# Patient Record
Sex: Female | Born: 1990 | Race: White | Hispanic: No | Marital: Married | Smoking: Former smoker
Health system: Southern US, Community
[De-identification: ages and names within clinical notes are randomized; demographics above are authoritative.]

## PROBLEM LIST (undated history)

## (undated) DIAGNOSIS — R569 Unspecified convulsions: Secondary | ICD-10-CM

## (undated) DIAGNOSIS — F209 Schizophrenia, unspecified: Secondary | ICD-10-CM

---

## 2014-03-12 ENCOUNTER — Emergency Department (HOSPITAL_COMMUNITY)
Admission: EM | Admit: 2014-03-12 | Discharge: 2014-03-13 | Disposition: A | Discharge status: Home / Self Care | Payer: Self-pay | Attending: Emergency Medicine | Admitting: Emergency Medicine

## 2014-03-12 ENCOUNTER — Encounter (HOSPITAL_COMMUNITY): Payer: Self-pay | Admitting: Emergency Medicine

## 2014-03-12 DIAGNOSIS — R4182 Altered mental status, unspecified: Secondary | ICD-10-CM | POA: Insufficient documentation

## 2014-03-12 DIAGNOSIS — Z3202 Encounter for pregnancy test, result negative: Secondary | ICD-10-CM | POA: Insufficient documentation

## 2014-03-12 DIAGNOSIS — F17200 Nicotine dependence, unspecified, uncomplicated: Secondary | ICD-10-CM | POA: Insufficient documentation

## 2014-03-12 DIAGNOSIS — F4321 Adjustment disorder with depressed mood: Secondary | ICD-10-CM | POA: Insufficient documentation

## 2014-03-12 LAB — RAPID URINE DRUG SCREEN, HOSP PERFORMED
Amphetamines: NOT DETECTED
BARBITURATES: NOT DETECTED
Benzodiazepines: NOT DETECTED
COCAINE: NOT DETECTED
Opiates: NOT DETECTED
Tetrahydrocannabinol: NOT DETECTED

## 2014-03-12 LAB — COMPREHENSIVE METABOLIC PANEL
ALK PHOS: 76 U/L (ref 39–117)
ALT: 13 U/L (ref 0–35)
AST: 18 U/L (ref 0–37)
Albumin: 3.9 g/dL (ref 3.5–5.2)
Anion gap: 12 (ref 5–15)
BILIRUBIN TOTAL: 0.2 mg/dL — AB (ref 0.3–1.2)
BUN: 12 mg/dL (ref 6–23)
CHLORIDE: 104 meq/L (ref 96–112)
CO2: 25 mEq/L (ref 19–32)
Calcium: 9.1 mg/dL (ref 8.4–10.5)
Creatinine, Ser: 0.69 mg/dL (ref 0.50–1.10)
GFR calc Af Amer: >90 mL/min (ref >90)
GLUCOSE: 118 mg/dL — AB (ref 70–99)
POTASSIUM: 3.7 meq/L (ref 3.7–5.3)
Sodium: 141 mEq/L (ref 137–147)
Total Protein: 7 g/dL (ref 6.0–8.3)

## 2014-03-12 LAB — URINALYSIS, ROUTINE W REFLEX MICROSCOPIC
BILIRUBIN URINE: NEGATIVE
Glucose, UA: NEGATIVE mg/dL
HGB URINE DIPSTICK: NEGATIVE
KETONES UR: NEGATIVE mg/dL
Leukocytes, UA: NEGATIVE
Nitrite: NEGATIVE
PROTEIN: NEGATIVE mg/dL
Specific Gravity, Urine: 1.02 (ref 1.005–1.030)
UROBILINOGEN UA: 0.2 mg/dL (ref 0.0–1.0)
pH: 6.5 (ref 5.0–8.0)

## 2014-03-12 LAB — CBC
HEMATOCRIT: 41.7 % (ref 36.0–46.0)
Hemoglobin: 14.5 g/dL (ref 12.0–15.0)
MCH: 32 pg (ref 26.0–34.0)
MCHC: 34.8 g/dL (ref 30.0–36.0)
MCV: 92.1 fL (ref 78.0–100.0)
Platelets: 183 10*3/uL (ref 150–400)
RBC: 4.53 MIL/uL (ref 3.87–5.11)
RDW: 12.1 % (ref 11.5–15.5)
WBC: 8.7 10*3/uL (ref 4.0–10.5)

## 2014-03-12 LAB — ETHANOL

## 2014-03-12 LAB — CBG MONITORING, ED: Glucose-Capillary: 125 mg/dL — ABNORMAL HIGH (ref 70–99)

## 2014-03-12 LAB — PREGNANCY, URINE: Preg Test, Ur: NEGATIVE

## 2014-03-12 MED ORDER — LORAZEPAM 0.5 MG PO TABS: 0.5000 mg | ORAL_TABLET | Freq: Four times a day (QID) | ORAL | Status: AC | PRN

## 2014-03-12 NOTE — ED Provider Notes (Signed)
CSN: [id number]     Arrival date & time 03/12/14  1906 History  This chart was scribed for Gerhard Munchobert Alfa Leibensperger, MD by Nicholos Johnsenise Iheanachor, ED scribe. This patient was seen in room APA15/APA15 and the patient's care was started at 8:38 PM.     Chief Complaint  Patient presents with  . Altered Mental Status    The history is provided by the patient, a relative and the spouse. No language interpreter was used.   HPI Comments: Melissa Shea is a 23 y.o. female who presents to the Emergency Department complaining of gradually worsening altered mental status for the past few weeks. Per grandmother, patient has been having frequent panic attacks. States during an episode, pt will have SOB. Onset 10 hours ago; pt is having difficulty speaking and difficulty ambulating. Stuttering more and mild shaking primarily in the upper extremities. Pt reports a HA; states these were occuring prior to mental sxs. No recent trauma or falls to report. [facility] and was told she was just having an acute panic attack.  Denies confusion or disorientation but grandmother states she has been having some memory loss. Been taking Celexa for the past couple of months. Pt recently lost custody of her child to her ex-boyfriend. Admits to smoking cigarettes; denies alcohol. Otherwise healthy.  History reviewed. No pertinent past medical history. History reviewed. No pertinent past surgical history. History reviewed. No pertinent family history. History  Substance Use Topics  . Smoking status: Current Every Day Smoker  . Smokeless tobacco: Not on file  . Alcohol Use: No   OB History   Grav Para Term Preterm Abortions TAB SAB Ect Mult Living                 Review of Systems  Constitutional:       Per HPI, otherwise negative  HENT:       Per HPI, otherwise negative  Respiratory:       Per HPI, otherwise negative  Cardiovascular:       Per HPI, otherwise negative  Gastrointestinal:  Negative for vomiting.  Endocrine:       Negative aside from HPI  Genitourinary:       Neg aside from HPI   Musculoskeletal:       Per HPI, otherwise negative  Skin: Negative.   Neurological: Positive for headaches. Negative for syncope.  Psychiatric/Behavioral: Negative for confusion.   Allergies  Sulfa antibiotics and Sulfur  Home Medications   Prior to Admission medications   Not on File   Triage vitals: BP 106/45  Pulse 91  Temp(Src) 99.3 F (37.4 C) (Oral)  Resp 20  Ht 5\' 6"  (1.676 m)  Wt 180 lb (81.647 kg)  BMI 29.07 kg/m2  SpO2 99%  Physical Exam  Nursing note and vitals reviewed. Constitutional: She is oriented to person, place, and time. She appears well-developed and well-nourished. No distress.  HENT:  Head: Normocephalic and atraumatic.  Eyes: Conjunctivae and EOM are normal. Pupils are equal, round, and reactive to light.  Cardiovascular: Normal rate and regular rhythm.   Pulmonary/Chest: Effort normal and breath sounds normal. No stridor. No respiratory distress.  Abdominal: She exhibits no distension.  Musculoskeletal: She exhibits no edema.  Neurological: She is alert and oriented to person, place, and time. No cranial nerve deficit.  Following commands. Good grip strength bilaterally in upper extremities.   Skin: Skin is warm and dry.  Psychiatric: She has a normal mood and affect.  ED Course  Procedures (including critical care time)  COORDINATION OF CARE: At 8:46 PM: Discussed treatment plan with patient which includes blood work and UA. Patient agrees.    Labs Review Labs Reviewed  COMPREHENSIVE METABOLIC PANEL - Abnormal; Notable for the following:    Glucose, Bld 118 (*)    Total Bilirubin 0.2 (*)    All other components within normal limits  CBG MONITORING, ED - Abnormal; Notable for the following:    Glucose-Capillary 125 (*)    All other components within normal limits  URINE RAPID DRUG SCREEN (HOSP PERFORMED)  CBC  ETHANOL   URINALYSIS, ROUTINE W REFLEX MICROSCOPIC  PREGNANCY, URINE   I obtained the patient's records from outside hospital, including lab results.  11:59 PM On repeat exam the patient is in no distress.  With family members out of the room the patient speaks, quietly, but clearly, that she is nonsuicidal, is feeling overwhelmed, and would appreciate referral to outside counseling centers, but prefers discharge over inpatient psychiatric evaluation. MDM  This young female presents with his abdomen custody of her son with ongoing change in behavior consistent with acute grief reaction, possible conversion disorder. Patient has no suicidal ideation, no plan, and when she is calm, she speaks understandably, demonstrates some insight into her condition.  She was provided multiple resources for outpatient followup, and discharged in stable condition after discussed all findings, plan with her and her family members.   I personally performed the services described in this documentation, which was scribed in my presence. The recorded information has been reviewed and is accurate.     Gerhard Munch, MD 03/13/14 0001

## 2014-03-12 NOTE — ED Notes (Signed)
Pt. Family reports pt. Had a panic attack yesterday and was [facility] and [facility]. Pt. Family reports that pt. Appeared normal this morning. Reports that family was called while pt. Was in class this morning due to altered mental status. Pt. Hands shaking. Pt. Voice stuttering. Pt. Reports that she doesn't remember what happened earlier today. Pt. Denies LOC. Pt. Family reports that she has been taking Celexa for a few months now. Pt. Family reports that pt. Went to court on Friday and an ex-boyfriend was given custody of her child.

## 2014-03-12 NOTE — Discharge Instructions (Signed)
As discussed, the grieving process can be long, and it is important that you follow up with both a primary care physician and a counselor for assistance.  Please take all medication as directed, and do not hesitate to return here for concerning changes in your condition   Grief Reaction Grief is a normal response to the death of someone close to you. Feelings of fear, anger, and guilt can affect almost everyone who loses someone they love. Symptoms of depression are also common. These include problems with sleep, loss of appetite, and lack of energy. These grief reaction symptoms often last for weeks to months after a loss. They may also return during special times that remind you of the person you lost, such as an anniversary or birthday. Anxiety, insomnia, irritability, and deep depression may last beyond the period of normal grief. If you experience these feelings for 6 months or longer, you may have clinical depression. Clinical depression requires further medical attention. If you think that you have clinical depression, you should contact your caregiver. If you have a history of depression or a family history of depression, you are at greater risk of clinical depression. You are also at greater risk of developing clinical depression if the loss was traumatic or the loss was of someone with whom you had unresolved issues.  A grief reaction can become complicated by being blocked. This means being unable to cry or express extreme emotions. This may prolong the grieving period and worsen the emotional effects of the loss. Mourning is a natural event in human life. A healthy grief reaction is one that is not blocked. It requires a time of sadness and readjustment. It is very important to share your sorrow and fear with others, especially close friends and family. Professional counselors and clergy can also help you process your grief. Document Released: 08/02/2005 Document Revised: 12/17/2013 Document  Reviewed: 04/12/2006 [facility] Patient Information 2015 [employer] [place]. This information is not intended to replace advice given to you by your health care provider. Make sure you discuss any questions you have with your health care provider.      Emergency Department Resource Guide  Behavioral Health Resources in the Community:  Intensive Outpatient Programs  Organization         Address  Phone  Notes  [facility] [address], [place], [place] [phone]   [facility] [address], [place], [place] [phone]   [facility] [address], [place], [place]  [phone]   [facility] [address],  [place], [place] [phone] or [phone]     Psychological Services  Organization          Address  Phone  Notes  [facility]  [phone]- [phone]   [facility]  [phone]- [phone]   [facility] [phone], [place] [phone] or [phone]    Mobile Crisis Teams  Organization         Address  Phone  Notes  [facility], Mobile Crisis Care Unit  [phone]   [facility]  [address] [place], [place] [phone]   Doristine LocksSharon DeEsch [address] [place] [phone]    Self-Help/Support Groups  Organization         Address  Phone             Notes  [facility] [place] - variety of support groups  [phone] Call for more information  Narcotics Anonymous (NA), [facility] [address], [employer] [place]  2 meetings at this location    [facility] Organization         Address  Phone  Notes  [facility]  [phone]   Angie Fava, PhD [address] [place], [place]   [phone] or [phone]   [facility]   [address] [place], [place] [phone]   [facility] [address]  [place], [place] [phone] Insurance/Medicaid/sponsorship through [facility] and Families [address]                                    [place], [place] [phone] Therapy/tele-psych/case  [facility] [address], [place] [phone]    Dr. Lolly Mustache  [phone]   [facility]  [employer] [facility] Dept. 1) [address] [place] 2) [address] [place] 3)  [address], [place] [phone]  [phone]   [facility] Child Abuse Hotline [phone] or [phone] (After Hours)

## 2014-03-12 NOTE — ED Notes (Signed)
Patient's family reports patient has had altered mental status and has had trouble talking and has been moaning throughout the day. States patient has had trouble walking and has been shaking. [facility] and diagnosed with Panic Disorder. Patient alert in triage moaning, shaking hands, and rotating head back in forth. Patient will follow commands.

## 2014-03-12 NOTE — ED Notes (Signed)
When asked if she is having any pain, patient did point to head. Then asked patient if her head hurts and patient responded with, "yes."

## 2014-03-12 NOTE — ED Notes (Signed)
Dr.Lockwood at bedside  

## 2014-03-13 NOTE — ED Notes (Signed)
Pt. Hands no longer shaking. Pt. Able to ambulate without difficulty.

## 2015-02-12 ENCOUNTER — Encounter (HOSPITAL_COMMUNITY): Payer: Self-pay | Admitting: Emergency Medicine

## 2015-02-12 ENCOUNTER — Observation Stay (HOSPITAL_COMMUNITY): Admission: EM | Admit: 2015-02-12 | Payer: Self-pay | Source: Intra-hospital | Admitting: Psychiatry

## 2015-02-12 ENCOUNTER — Emergency Department (HOSPITAL_COMMUNITY)
Admission: EM | Admit: 2015-02-12 | Discharge: 2015-02-12 | Disposition: A | Discharge status: Home / Self Care | Payer: Self-pay | Attending: Emergency Medicine | Admitting: Emergency Medicine

## 2015-02-12 DIAGNOSIS — Z720 Tobacco use: Secondary | ICD-10-CM | POA: Insufficient documentation

## 2015-02-12 DIAGNOSIS — Z79899 Other long term (current) drug therapy: Secondary | ICD-10-CM | POA: Insufficient documentation

## 2015-02-12 DIAGNOSIS — F1210 Cannabis abuse, uncomplicated: Secondary | ICD-10-CM | POA: Insufficient documentation

## 2015-02-12 DIAGNOSIS — R569 Unspecified convulsions: Secondary | ICD-10-CM | POA: Insufficient documentation

## 2015-02-12 DIAGNOSIS — F1310 Sedative, hypnotic or anxiolytic abuse, uncomplicated: Secondary | ICD-10-CM | POA: Insufficient documentation

## 2015-02-12 DIAGNOSIS — F209 Schizophrenia, unspecified: Secondary | ICD-10-CM | POA: Insufficient documentation

## 2015-02-12 DIAGNOSIS — F419 Anxiety disorder, unspecified: Secondary | ICD-10-CM | POA: Insufficient documentation

## 2015-02-12 DIAGNOSIS — Z915 Personal history of self-harm: Secondary | ICD-10-CM | POA: Insufficient documentation

## 2015-02-12 HISTORY — DX: Unspecified convulsions: R56.9

## 2015-02-12 HISTORY — DX: Schizophrenia, unspecified: F20.9

## 2015-02-12 LAB — CBC WITH DIFFERENTIAL/PLATELET
BASOS ABS: 0 10*3/uL (ref 0.0–0.1)
Basophils Relative: 0 % (ref 0–1)
EOS PCT: 1 % (ref 0–5)
Eosinophils Absolute: 0.1 10*3/uL (ref 0.0–0.7)
HCT: 43.7 % (ref 36.0–46.0)
Hemoglobin: 15 g/dL (ref 12.0–15.0)
LYMPHS PCT: 14 % (ref 12–46)
Lymphs Abs: 1.4 10*3/uL (ref 0.7–4.0)
MCH: 32.3 pg (ref 26.0–34.0)
MCHC: 34.3 g/dL (ref 30.0–36.0)
MCV: 94 fL (ref 78.0–100.0)
Monocytes Absolute: 0.7 10*3/uL (ref 0.1–1.0)
Monocytes Relative: 7 % (ref 3–12)
NEUTROS PCT: 78 % — AB (ref 43–77)
Neutro Abs: 7.6 10*3/uL (ref 1.7–7.7)
PLATELETS: 248 10*3/uL (ref 150–400)
RBC: 4.65 MIL/uL (ref 3.87–5.11)
RDW: 12.4 % (ref 11.5–15.5)
WBC: 9.9 10*3/uL (ref 4.0–10.5)

## 2015-02-12 LAB — RAPID URINE DRUG SCREEN, HOSP PERFORMED
Amphetamines: NOT DETECTED
BARBITURATES: NOT DETECTED
Benzodiazepines: POSITIVE — AB
Cocaine: NOT DETECTED
Opiates: NOT DETECTED
TETRAHYDROCANNABINOL: POSITIVE — AB

## 2015-02-12 LAB — URINALYSIS, ROUTINE W REFLEX MICROSCOPIC
BILIRUBIN URINE: NEGATIVE
Glucose, UA: NEGATIVE mg/dL
Hgb urine dipstick: NEGATIVE
Ketones, ur: NEGATIVE mg/dL
Leukocytes, UA: NEGATIVE
Nitrite: NEGATIVE
Protein, ur: NEGATIVE mg/dL
SPECIFIC GRAVITY, URINE: 1.009 (ref 1.005–1.030)
UROBILINOGEN UA: 0.2 mg/dL (ref 0.0–1.0)
pH: 6.5 (ref 5.0–8.0)

## 2015-02-12 LAB — ETHANOL

## 2015-02-12 LAB — BASIC METABOLIC PANEL
Anion gap: 10 (ref 5–15)
BUN: 14 mg/dL (ref 6–20)
CHLORIDE: 105 mmol/L (ref 101–111)
CO2: 24 mmol/L (ref 22–32)
Calcium: 9.5 mg/dL (ref 8.9–10.3)
Creatinine, Ser: 0.71 mg/dL (ref 0.44–1.00)
GFR calc non Af Amer: >60 mL/min (ref >60)
Glucose, Bld: 108 mg/dL — ABNORMAL HIGH (ref 65–99)
Potassium: 4.1 mmol/L (ref 3.5–5.1)
SODIUM: 139 mmol/L (ref 135–145)

## 2015-02-12 LAB — SALICYLATE LEVEL: Salicylate Lvl: <4 mg/dL (ref 2.8–30.0)

## 2015-02-12 NOTE — ED Notes (Signed)
Pt, being sent by a psych assessment facility, c/o seizure-like activity x 3 last week, SI w/ a plan, and auditory and visual hallucinations.  Facility reports the Pt was diagnosed w/ schizophrenia and generalized anxiety disorder 01/06/15.  Sts Pt had similar seizure-like episodes in the past, but had not had any for years until last week.  [facility].  Facility sts pt was not referred to Neurology, due to not having insurance.
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
## 2015-02-12 NOTE — ED Notes (Signed)
Patient has one bag of belongings at nurse's station outside of room 25.

## 2015-02-12 NOTE — ED Notes (Signed)
Pt reported that she has been having seizures weeks since her schizophrenia medication was change. Pt reported that last seizure act was Sunday and she [facility] and was instructed to come to the ER for evaluation. Pt denies SI/HI and hallucinations at this time. Family at bedside.

## 2015-02-12 NOTE — ED Notes (Addendum)
Pt is being sent in by her outpatient therapist due to worsening auditory and visual hallucinations related to her dx of schizophrenia. She has also been having frequent grand mal seizures, per the note that was sent in with the patient, and has not been able to establish primary care or get on medication for this d/t limitations in resources. Pt currently denies SI/HI. Voluntary and willing to seek inpatient treatment if this is necessary. Alert and oriented.

## 2015-02-12 NOTE — Discharge Instructions (Signed)
You were evaluated in the ED today and there does not appear to be any emergent cause for your symptoms at this time. Your exam, labs were all reassuring. It is important to follow up with your primary care, psychiatrist or counselor for further evaluation and management of your symptoms. Return to ED for worsening symptoms.

## 2015-02-12 NOTE — ED Notes (Signed)
Resting quietly with eye closed. Easily arousable. Verbally responsive. Resp even and unlabored. ABC's intact. No behavior problems noted. NAD noted. Family at bedside. Pt made aware of delays and orders. Verbalized understanding.

## 2015-02-12 NOTE — ED Provider Notes (Signed)
CSN: [id number]     Arrival date & time 02/12/15  1529 History   First MD Initiated Contact with Patient 02/12/15 [phone]     Chief Complaint  Patient presents with  . Medical Clearance  . Seizures     (Consider location/radiation/quality/duration/timing/severity/associated sxs/prior Treatment) HPI Melissa CurrentBrittney N Shea is a 24 y.o. female history of schizophrenia, anxiety, reported seizures, suicide attempts, comes in for evaluation of inpatient services at the recommendation of her psychiatrist and therapist. Patient reports he has documented schizophrenia has been on medication for the past 6 months, but for the past 2 months she has had intermittent seizures that she reports is secondary to the medication. She reports taking Risperdal daily. Reports regular auditory and visual hallucinations. Most recently saw a black figure this morning in her doorway telling her "this is all your fault". Patient does not wish to further elaborate. She denies alcohol consumption since December. Denies any other illicit substance use. She reports intermittent Xanax use for anxiety that she receives from her mother. Last use of Xanax on Sunday. Patient reports her last seizure was also on Sunday. Her sister and roommate accompanying her and reports the seizure activity as "whole body shaking, clenched fists and drooling". States patient will sometimes be confused and have a stutter following these seizure-like events. Patient denies any other medical problems. No discomfort now in ED. Denies suicidal or homicidal ideations.  Past Medical History  Diagnosis Date  . Schizophrenia   . Seizures    No past surgical history on file. No family history on file. History  Substance Use Topics  . Smoking status: Current Every Day Smoker  . Smokeless tobacco: Not on file  . Alcohol Use: No   OB History    No data available     Review of Systems A 10 point review of systems was completed and was negative except for  pertinent positives and negatives as mentioned in the history of present illness     Allergies  Sulfa antibiotics and Sulfur  Home Medications   Prior to Admission medications   Medication Sig Start Date End Date Taking? Authorizing Provider  levonorgestrel (MIRENA) 20 MCG/24HR IUD 1 each by Intrauterine route once.   Yes Historical Provider, MD  risperiDONE (RISPERDAL) 1 MG tablet Take 1 mg by mouth at bedtime.   Yes Historical Provider, MD  sertraline (ZOLOFT) 25 MG tablet Take 25 mg by mouth daily.   Yes Historical Provider, MD  LORazepam (ATIVAN) 0.5 MG tablet Take 1 tablet (0.5 mg total) by mouth every 6 (six) hours as needed for anxiety. Patient not taking: Reported on 02/12/2015 03/12/14   Gerhard Munchobert Lockwood, MD   BP 112/56 mmHg  Pulse 68  Temp(Src) 98.3 F (36.8 C) (Oral)  Resp 17  SpO2 98% Physical Exam  Constitutional: She is oriented to person, place, and time. She appears well-developed and well-nourished.  HENT:  Head: Normocephalic and atraumatic.  Mouth/Throat: Oropharynx is clear and moist.  Eyes: Conjunctivae are normal. Pupils are equal, round, and reactive to light. Right eye exhibits no discharge. Left eye exhibits no discharge. No scleral icterus.  Neck: Neck supple.  Cardiovascular: Normal rate, regular rhythm and normal heart sounds.   Pulmonary/Chest: Effort normal and breath sounds normal. No respiratory distress. She has no wheezes. She has no rales.  Abdominal: Soft. There is no tenderness.  Musculoskeletal: She exhibits no tenderness.  Neurological: She is alert and oriented to person, place, and time.  Cranial Nerves II-XII grossly intact. Motor and  sensation appear baseline for patient. Gait is baseline without ataxia. Completes fine motor coordination movements without difficulty.  Skin: Skin is warm and dry. No rash noted.  Psychiatric: She has a normal mood and affect.  Patient is calm, appropriate affect. Answers questions appropriately. No evidence  of acute psychosis, postictal state.  Nursing note and vitals reviewed.   ED Course  Procedures (including critical care time) Labs Review Labs Reviewed  BASIC METABOLIC PANEL - Abnormal; Notable for the following:    Glucose, Bld 108 (*)    All other components within normal limits  CBC WITH DIFFERENTIAL/PLATELET - Abnormal; Notable for the following:    Neutrophils Relative % 78 (*)    All other components within normal limits  URINE RAPID DRUG SCREEN, HOSP PERFORMED - Abnormal; Notable for the following:    Benzodiazepines POSITIVE (*)    Tetrahydrocannabinol POSITIVE (*)    All other components within normal limits  SALICYLATE LEVEL  ETHANOL  URINALYSIS, ROUTINE W REFLEX MICROSCOPIC (NOT AT [facility])    Imaging Review No results found.   EKG Interpretation None     Filed Vitals:   02/12/15 1540 02/12/15 1645 02/12/15 1759  BP: 108/61  112/56  Pulse: 96  68  Temp: 98.1 F (36.7 C)  98.3 F (36.8 C)  TempSrc: Oral  Oral  Resp: 16  17  SpO2: 97% 98% 98%    MDM  Vitals stable - WNL -afebrile Pt resting comfortably in ED. PE--physical exam is benign. Labwork--labs are noncontributory. UDS is positive for benzodiazepine's and THC.  DDX--patient is clear from a medical standpoint. She is not suicidal or homicidal. Not actively hallucinating in the ED. Disposable according to TTS evaluation. Particular yes, patient offered overnight observational bed. Patient declines, prefers to call her counselor and psychiatrist tomorrow for reevaluation. At this time, I do not feel there is any reason to keep patient in the ED. Her evaluation is complete, she is medically cleared and there is no evidence of other acute or emergent pathology at this time. Discussed with friends and family at the bedside, all are amenable to plan for discharge. Will return to ED if necessary.  I discussed all relevant lab findings and imaging results with pt and they verbalized understanding. Discussed  f/u with PCP within 48 hrs and return precautions, pt very amenable to plan.  Final diagnoses:  Anxiety        Joycie Peek, PA-C 02/12/15 2037  Elwin Mocha, MD 02/12/15 [phone]

## 2016-04-30 ENCOUNTER — Emergency Department (HOSPITAL_COMMUNITY)
Admission: EM | Admit: 2016-04-30 | Discharge: 2016-05-01 | Disposition: A | Discharge status: Home / Self Care | Payer: Self-pay | Attending: Emergency Medicine | Admitting: Emergency Medicine

## 2016-04-30 ENCOUNTER — Encounter (HOSPITAL_COMMUNITY): Payer: Self-pay | Admitting: Emergency Medicine

## 2016-04-30 DIAGNOSIS — R197 Diarrhea, unspecified: Secondary | ICD-10-CM | POA: Insufficient documentation

## 2016-04-30 DIAGNOSIS — R1010 Upper abdominal pain, unspecified: Secondary | ICD-10-CM | POA: Insufficient documentation

## 2016-04-30 DIAGNOSIS — R112 Nausea with vomiting, unspecified: Secondary | ICD-10-CM | POA: Insufficient documentation

## 2016-04-30 DIAGNOSIS — F17200 Nicotine dependence, unspecified, uncomplicated: Secondary | ICD-10-CM | POA: Insufficient documentation

## 2016-04-30 NOTE — ED Triage Notes (Signed)
Onset 3 days ago, abdominal pain, vomiting and diarrhea

## 2016-05-01 LAB — URINALYSIS, ROUTINE W REFLEX MICROSCOPIC
BILIRUBIN URINE: NEGATIVE
GLUCOSE, UA: NEGATIVE mg/dL
Hgb urine dipstick: NEGATIVE
KETONES UR: NEGATIVE mg/dL
Leukocytes, UA: NEGATIVE
Nitrite: NEGATIVE
PH: 8 (ref 5.0–8.0)
Protein, ur: NEGATIVE mg/dL
SPECIFIC GRAVITY, URINE: 1.015 (ref 1.005–1.030)

## 2016-05-01 LAB — CBC WITH DIFFERENTIAL/PLATELET
Basophils Absolute: 0 10*3/uL (ref 0.0–0.1)
Basophils Relative: 0 %
Eosinophils Absolute: 0.2 10*3/uL (ref 0.0–0.7)
Eosinophils Relative: 3 %
HEMATOCRIT: 37.7 % (ref 36.0–46.0)
HEMOGLOBIN: 12.8 g/dL (ref 12.0–15.0)
LYMPHS PCT: 38 %
Lymphs Abs: 2.4 10*3/uL (ref 0.7–4.0)
MCH: 32 pg (ref 26.0–34.0)
MCHC: 34 g/dL (ref 30.0–36.0)
MCV: 94.3 fL (ref 78.0–100.0)
MONOS PCT: 8 %
Monocytes Absolute: 0.5 10*3/uL (ref 0.1–1.0)
NEUTROS ABS: 3.2 10*3/uL (ref 1.7–7.7)
Neutrophils Relative %: 51 %
Platelets: 164 10*3/uL (ref 150–400)
RBC: 4 MIL/uL (ref 3.87–5.11)
RDW: 12.1 % (ref 11.5–15.5)
WBC: 6.3 10*3/uL (ref 4.0–10.5)

## 2016-05-01 LAB — LIPASE, BLOOD: Lipase: 17 U/L (ref 11–51)

## 2016-05-01 LAB — COMPREHENSIVE METABOLIC PANEL
ALT: 15 U/L (ref 14–54)
AST: 16 U/L (ref 15–41)
Albumin: 4.2 g/dL (ref 3.5–5.0)
Alkaline Phosphatase: 45 U/L (ref 38–126)
Anion gap: 8 (ref 5–15)
BILIRUBIN TOTAL: 0.5 mg/dL (ref 0.3–1.2)
BUN: 12 mg/dL (ref 6–20)
CHLORIDE: 108 mmol/L (ref 101–111)
CO2: 24 mmol/L (ref 22–32)
Calcium: 8.8 mg/dL — ABNORMAL LOW (ref 8.9–10.3)
Creatinine, Ser: 0.67 mg/dL (ref 0.44–1.00)
GFR calc Af Amer: >60 mL/min (ref >60)
GFR calc non Af Amer: >60 mL/min (ref >60)
GLUCOSE: 88 mg/dL (ref 65–99)
POTASSIUM: 3.4 mmol/L — AB (ref 3.5–5.1)
Sodium: 140 mmol/L (ref 135–145)
Total Protein: 6.5 g/dL (ref 6.5–8.1)

## 2016-05-01 LAB — PREGNANCY, URINE: Preg Test, Ur: NEGATIVE

## 2016-05-01 MED ORDER — ONDANSETRON 4 MG PO TBDP
4.0000 mg | ORAL_TABLET | Freq: Once | ORAL | Status: AC
  Administered 2016-05-01: 4 mg via ORAL
  Filled 2016-05-01: qty 1

## 2016-05-01 MED ORDER — ONDANSETRON HCL 4 MG PO TABS: 4.0000 mg | ORAL_TABLET | Freq: Three times a day (TID) | ORAL | 0 refills | Status: DC | PRN

## 2016-05-01 NOTE — ED Provider Notes (Signed)
[facility]-EMERGENCY DEPT Provider Note   CSN: [id number] Arrival date & time: 04/30/16  2305  By signing my name below, I, [name], attest that this documentation has been prepared under the direction and in the presence of [name], MD. Electronically signed, [name], ED Scribe. 05/01/16. 12:49 AM.  Time seen 12:20 AM  History   Chief Complaint Chief Complaint  Patient presents with  . Abdominal Pain    HPI HPI Comments: Melissa Shea is a 25 y.o. female, who presents to the Emergency Department complaining of intermittent abdominal cramping with associated nausea, vomiting, and diarrhea, onset two days ago. She reports one episode of vomiting today and three episodes yesterday. Today she had two small, hard ball like bowel movements today and approximately 10 watery stools yesterday. 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 She has been able to tolerate fluids without difficulty. She is not a smoker and does not drink alcohol. Pt denies any lightheadedness or dizziness, fever, feeling thirsty, or having decreased urinary output. She denies eating anything she thinks may have made her ill.    The history is provided by the patient. No language interpreter was used.    Past Medical History:  Diagnosis Date  . Schizophrenia (HCC)   . Seizures (HCC)     There are no active problems to display for this patient.   History reviewed. No pertinent surgical history.  OB History    No data available       Home Medications    Prior to Admission medications   Medication Sig Start Date End Date Taking? Authorizing Provider  levonorgestrel (MIRENA) 20 MCG/24HR IUD 1 each by Intrauterine route once.    Historical Provider, MD  LORazepam (ATIVAN) 0.5 MG tablet Take 1 tablet (0.5 mg total) by mouth every 6 (six) hours as needed for anxiety. Patient not taking: Reported on 02/12/2015 03/12/14   Gerhard Munch, MD  ondansetron (ZOFRAN) 4 MG tablet Take 1 tablet (4 mg total) by mouth every 8 (eight) hours as needed. 05/01/16   [name], MD  risperiDONE (RISPERDAL) 1 MG tablet Take 1 mg by mouth at bedtime.    Historical Provider, MD  sertraline (ZOLOFT) 25 MG tablet Take 25 mg by mouth daily.    Historical Provider, MD    Family History No family history on file.  Social History Social History  Substance Use Topics  . Smoking status: Current Every Day Smoker  . Smokeless tobacco: Never Used  . Alcohol use No  employed in [place]   Allergies   Sulfa antibiotics and Sulfur   Review of Systems Review of Systems  Gastrointestinal: Positive for abdominal pain, diarrhea, nausea and vomiting.  Neurological: Negative for dizziness and light-headedness.  All other systems reviewed and are negative.    Physical Exam Updated Vital Signs BP 122/67 (BP Location: Left Arm)   Pulse (!) 57   Temp 98.5 F (36.9 C) (Oral)   Resp 20   Ht 5\' 4"  (1.626 m)   Wt 174 lb (78.9 kg)   SpO2 100%   BMI 29.87 kg/m   Vital signs normal except bradycardia   Physical Exam  Constitutional: She is oriented to person, place, and time. She appears well-developed and well-nourished.  Non-toxic appearance. She does not appear ill. No distress.  HENT:  Head: Normocephalic and atraumatic.  Right Ear: External ear normal.  Left Ear: External ear  normal.  Nose: Nose normal. No mucosal edema or rhinorrhea.  Mouth/Throat: Mucous membranes are normal. No dental abscesses or uvula swelling.  Eyes: Conjunctivae and EOM are normal. Pupils are equal, round, and reactive to light.  Neck: Normal range of motion and full passive range of motion without pain. Neck supple.  Cardiovascular: Normal rate, regular rhythm and normal heart sounds.  Exam reveals no gallop and no friction rub.   No murmur heard. Pulmonary/Chest: Effort normal and breath sounds normal. No respiratory distress. She has no wheezes. She has no  rhonchi. She has no rales. She exhibits no tenderness and no crepitus.  Abdominal: Soft. Normal appearance and bowel sounds are normal. She exhibits no distension. There is tenderness. There is no rebound and no guarding.    Mild epigastric tednerness to palpation  Musculoskeletal: Normal range of motion. She exhibits no edema or tenderness.  Moves all extremities well.   Neurological: She is alert and oriented to person, place, and time. She has normal strength. No cranial nerve deficit.  Skin: Skin is warm, dry and intact. No rash noted. No erythema. No pallor.  Psychiatric: Her speech is normal and behavior is normal.  Flat affect  Nursing note and vitals reviewed.    ED Treatments / Results  Labs (all labs ordered are listed, but only abnormal results are displayed) Results for orders placed or performed during the hospital encounter of 04/30/16  Comprehensive metabolic panel  Result Value Ref Range   Sodium 140 135 - 145 mmol/L   Potassium 3.4 (L) 3.5 - 5.1 mmol/L   Chloride 108 101 - 111 mmol/L   CO2 24 22 - 32 mmol/L   Glucose, Bld 88 65 - 99 mg/dL   BUN 12 6 - 20 mg/dL   Creatinine, Ser 4.780.67 0.44 - 1.00 mg/dL   Calcium 8.8 (L) 8.9 - 10.3 mg/dL   Total Protein 6.5 6.5 - 8.1 g/dL   Albumin 4.2 3.5 - 5.0 g/dL   AST 16 15 - 41 U/L   ALT 15 14 - 54 U/L   Alkaline Phosphatase 45 38 - 126 U/L   Total Bilirubin 0.5 0.3 - 1.2 mg/dL   GFR calc non Af Amer >60 >60 mL/min   GFR calc Af Amer >60 >60 mL/min   Anion gap 8 5 - 15  Lipase, blood  Result Value Ref Range   Lipase 17 11 - 51 U/L  CBC with Differential  Result Value Ref Range   WBC 6.3 4.0 - 10.5 K/uL   RBC 4.00 3.87 - 5.11 MIL/uL   Hemoglobin 12.8 12.0 - 15.0 g/dL   HCT 29.537.7 62.136.0 - 30.846.0 %   MCV 94.3 78.0 - 100.0 fL   MCH 32.0 26.0 - 34.0 pg   MCHC 34.0 30.0 - 36.0 g/dL   RDW 65.712.1 84.611.5 - 96.215.5 %   Platelets 164 150 - 400 K/uL   Neutrophils Relative % 51 %   Neutro Abs 3.2 1.7 - 7.7 K/uL   Lymphocytes Relative  38 %   Lymphs Abs 2.4 0.7 - 4.0 K/uL   Monocytes Relative 8 %   Monocytes Absolute 0.5 0.1 - 1.0 K/uL   Eosinophils Relative 3 %   Eosinophils Absolute 0.2 0.0 - 0.7 K/uL   Basophils Relative 0 %   Basophils Absolute 0.0 0.0 - 0.1 K/uL  Urinalysis, Routine w reflex microscopic  Result Value Ref Range   Color, Urine YELLOW YELLOW   APPearance CLEAR CLEAR   Specific Gravity, Urine 1.015  1.005 - 1.030   pH 8.0 5.0 - 8.0   Glucose, UA NEGATIVE NEGATIVE mg/dL   Hgb urine dipstick NEGATIVE NEGATIVE   Bilirubin Urine NEGATIVE NEGATIVE   Ketones, ur NEGATIVE NEGATIVE mg/dL   Protein, ur NEGATIVE NEGATIVE mg/dL   Nitrite NEGATIVE NEGATIVE   Leukocytes, UA NEGATIVE NEGATIVE  Pregnancy, urine  Result Value Ref Range   Preg Test, Ur NEGATIVE NEGATIVE   Laboratory interpretation all normal except mild hypokalemia   Procedures Procedures (including critical care time)  Medications Ordered in ED Medications  ondansetron (ZOFRAN-ODT) disintegrating tablet 4 mg (4 mg Oral Given 05/01/16 0050)     Initial Impression / Assessment and Plan / ED Course  I have reviewed the triage vital signs and the nursing notes.  Pertinent labs & imaging results that were available during my care of the patient were reviewed by me and considered in my medical decision making (see chart for details).  Clinical Course     DIAGNOSTIC STUDIES: Oxygen Saturation is 100% on RA, normal by my interpretation.  COORDINATION OF CARE: 12:25 AM-Will order Zofran. Discussed treatment plan with pt at bedside and pt agreed to plan. Patient given the option of getting IV fluids and IV Zofran or trying ODT Zofran and oral fluids. She elected to try oral fluids.  Patient was rechecked at 2 AM. She states her nausea is gone, her abdominal discomfort is gone. We reviewed her lab tests and she feels ready to be discharged.  Final Clinical Impressions(s) / ED Diagnoses   Final diagnoses:  Nausea vomiting and diarrhea    Upper abdominal pain    New Prescriptions New Prescriptions   ONDANSETRON (ZOFRAN) 4 MG TABLET    Take 1 tablet (4 mg total) by mouth every 8 (eight) hours as needed.    Plan discharge  [name], MD, Concha Pyo, MD 05/01/16 [phone]

## 2016-05-01 NOTE — Discharge Instructions (Signed)
Drink plenty of fluids, sports drinks will help prevent dehydration. Uses Zofran for nausea or vomiting. Avoid milk until the diarrhea is gone. Recheck if you get worse.

## 2016-05-01 NOTE — ED Notes (Signed)
Patient verbalizes understanding of discharge instructions, prescriptions, home care and follow up care. Patient out of department at this time. 

## 2016-05-01 NOTE — ED Notes (Signed)
Patient able to tolerate PO fluids

## 2016-10-13 ENCOUNTER — Emergency Department (HOSPITAL_COMMUNITY)
Admission: EM | Admit: 2016-10-13 | Discharge: 2016-10-13 | Disposition: A | Discharge status: Home / Self Care | Payer: Self-pay | Attending: Emergency Medicine | Admitting: Emergency Medicine

## 2016-10-13 ENCOUNTER — Encounter (HOSPITAL_COMMUNITY): Payer: Self-pay | Admitting: *Deleted

## 2016-10-13 DIAGNOSIS — L02512 Cutaneous abscess of left hand: Secondary | ICD-10-CM | POA: Insufficient documentation

## 2016-10-13 DIAGNOSIS — F17200 Nicotine dependence, unspecified, uncomplicated: Secondary | ICD-10-CM | POA: Insufficient documentation

## 2016-10-13 DIAGNOSIS — L03012 Cellulitis of left finger: Secondary | ICD-10-CM

## 2016-10-13 MED ORDER — DOXYCYCLINE HYCLATE 100 MG PO CAPS: 100.0000 mg | ORAL_CAPSULE | Freq: Two times a day (BID) | ORAL | 0 refills | Status: AC

## 2016-10-13 MED ORDER — HYDROGEN PEROXIDE 3 % EX SOLN
Freq: Every day | CUTANEOUS | Status: DC
  Filled 2016-10-13: qty 473

## 2016-10-13 MED ORDER — LIDOCAINE-EPINEPHRINE-TETRACAINE (LET) SOLUTION
3.0000 mL | Freq: Once | NASAL | Status: AC
  Administered 2016-10-13: 3 mL via TOPICAL
  Filled 2016-10-13: qty 3

## 2016-10-13 NOTE — Discharge Instructions (Signed)
Perform the warm soaks and take the antibiotics as prescribed. Follow up with your doctor in 2 days for a wound check. Return to the ED if you develop new or worsening symptoms.

## 2016-10-13 NOTE — ED Provider Notes (Signed)
[facility] Provider Note   CSN: [id number] Arrival date & time: 10/13/16  0245     History   Chief Complaint Chief Complaint  Patient presents with  . Finger Injury    HPI Melissa CurrentBrittney N Shea is a 26 y.o. female.  Patient states Melissa Shea "ripped a hangnail off her left thumb" yesterday now has severe pain and swelling along her left thumb cuticle. Melissa Shea is anxious and tearful. Denies any fever. Denies any vomiting. Melissa Shea is not diabetic. No focal weakness, numbness or tingling. No other direct trauma.   The history is provided by the patient.    Past Medical History:  Diagnosis Date  . Schizophrenia (HCC)   . Seizures (HCC)     There are no active problems to display for this patient.   History reviewed. No pertinent surgical history.  OB History    No data available       Home Medications    Prior to Admission medications   Medication Sig Start Date End Date Taking? Authorizing Provider  levonorgestrel (MIRENA) 20 MCG/24HR IUD 1 each by Intrauterine route once.    Historical Provider, MD  LORazepam (ATIVAN) 0.5 MG tablet Take 1 tablet (0.5 mg total) by mouth every 6 (six) hours as needed for anxiety. Patient not taking: Reported on 02/12/2015 03/12/14   Gerhard Munchobert Lockwood, MD  ondansetron (ZOFRAN) 4 MG tablet Take 1 tablet (4 mg total) by mouth every 8 (eight) hours as needed. 05/01/16   Devoria AlbeIva Knapp, MD  risperiDONE (RISPERDAL) 1 MG tablet Take 1 mg by mouth at bedtime.    Historical Provider, MD  sertraline (ZOLOFT) 25 MG tablet Take 25 mg by mouth daily.    Historical Provider, MD    Family History History reviewed. No pertinent family history.  Social History Social History  Substance Use Topics  . Smoking status: Current Every Day Smoker  . Smokeless tobacco: Never Used  . Alcohol use No     Allergies   Sulfa antibiotics and Sulfur   Review of Systems Review of Systems  Constitutional: Negative for activity change, appetite change and fever.  HENT:  Negative for congestion and rhinorrhea.   Respiratory: Negative for choking, chest tightness and stridor.   Gastrointestinal: Negative for abdominal pain, nausea and vomiting.  Genitourinary: Negative for dysuria, hematuria and vaginal bleeding.  Musculoskeletal: Positive for myalgias. Negative for arthralgias and joint swelling.  Neurological: Negative for dizziness and headaches.  A complete 10 system review of systems was obtained and all systems are negative except as noted in the HPI and PMH.     Physical Exam Updated Vital Signs BP 105/61 (BP Location: Left Arm)   Pulse 87   Temp 97.9 F (36.6 C) (Oral)   Resp 16   SpO2 100%   Physical Exam  Constitutional: Melissa Shea is oriented to person, place, and time. Melissa Shea appears well-developed and well-nourished. No distress.  HENT:  Head: Normocephalic and atraumatic.  Mouth/Throat: Oropharynx is clear and moist. No oropharyngeal exudate.  Eyes: Conjunctivae and EOM are normal. Pupils are equal, round, and reactive to light.  Neck: Normal range of motion. Neck supple.  No meningismus.  Cardiovascular: Normal rate, regular rhythm, normal heart sounds and intact distal pulses.   No murmur heard. Pulmonary/Chest: Effort normal and breath sounds normal. No respiratory distress.  Abdominal: Soft. There is no tenderness. There is no rebound and no guarding.  Musculoskeletal: Normal range of motion. Melissa Shea exhibits no edema or tenderness.  Minimal swelling and erythema to L thumb  cuticle on ulnar side. No fluctuance. ROM of IP and MCP joint intact.  No felon.   Neurological: Melissa Shea is alert and oriented to person, place, and time. No cranial nerve deficit. Melissa Shea exhibits normal muscle tone. Coordination normal.   5/5 strength throughout. CN 2-12 intact.Equal grip strength.   Skin: Skin is warm.  Psychiatric: Melissa Shea has a normal mood and affect. Her behavior is normal.  Nursing note and vitals reviewed.    ED Treatments / Results  Labs (all labs  ordered are listed, but only abnormal results are displayed) Labs Reviewed - No data to display  EKG  EKG Interpretation None       Radiology No results found.  Procedures .Marland KitchenIncision and Drainage Date/Time: 10/13/2016 3:55 AM Performed by: Glynn Octave Authorized by: Glynn Octave   Consent:    Consent obtained:  Verbal   Consent given by:  Patient   Risks discussed:  Bleeding, incomplete drainage and pain   Alternatives discussed:  Delayed treatment and observation Location:    Type:  Abscess   Location:  Upper extremity   Upper extremity location:  Finger   Finger location:  L thumb Pre-procedure details:    Skin preparation:  Betadine Anesthesia (see MAR for exact dosages):    Anesthesia method:  Topical application   Topical anesthetic:  LET Procedure type:    Complexity:  Simple Procedure details:    Needle aspiration: no     Incision types:  Single straight   Scalpel blade:  11   Drainage:  Purulent   Drainage amount:  Moderate   Wound treatment:  Wound left open   Packing materials:  None Post-procedure details:    Patient tolerance of procedure:  Tolerated well, no immediate complications   (including critical care time)  Medications Ordered in ED Medications  lidocaine-EPINEPHrine-tetracaine (LET) solution (not administered)     Initial Impression / Assessment and Plan / ED Course  I have reviewed the triage vital signs and the nursing notes.  Pertinent labs & imaging results that were available during my care of the patient were reviewed by me and considered in my medical decision making (see chart for details).     Patient with apparent early paronychia. Offered attempted incision and drainage versus observation.  I+D of paronychia performed.  Patient given warm soak instructions, antibiotics, PCP followup. Return to the ED if Melissa Shea develops new or worsening symptoms.   Final Clinical Impressions(s) / ED Diagnoses   Final diagnoses:   Paronychia of finger, left    New Prescriptions New Prescriptions   No medications on file     Glynn Octave, MD 10/13/16 [phone]

## 2016-10-13 NOTE — ED Triage Notes (Signed)
Pt c/o pain to left thumb; pt states she had a hangnail and pulled it off and now her thumb is swollen

## 2017-06-09 ENCOUNTER — Emergency Department (HOSPITAL_COMMUNITY)
Admission: EM | Admit: 2017-06-09 | Discharge: 2017-06-09 | Disposition: A | Discharge status: Home / Self Care | Payer: Self-pay | Attending: Emergency Medicine | Admitting: Emergency Medicine

## 2017-06-09 ENCOUNTER — Encounter (HOSPITAL_COMMUNITY): Payer: Self-pay

## 2017-06-09 DIAGNOSIS — R490 Dysphonia: Secondary | ICD-10-CM | POA: Insufficient documentation

## 2017-06-09 DIAGNOSIS — Z79899 Other long term (current) drug therapy: Secondary | ICD-10-CM | POA: Insufficient documentation

## 2017-06-09 DIAGNOSIS — F17210 Nicotine dependence, cigarettes, uncomplicated: Secondary | ICD-10-CM | POA: Insufficient documentation

## 2017-06-09 DIAGNOSIS — J029 Acute pharyngitis, unspecified: Secondary | ICD-10-CM | POA: Insufficient documentation

## 2017-06-09 LAB — RAPID STREP SCREEN (MED CTR MEBANE ONLY): Streptococcus, Group A Screen (Direct): NEGATIVE

## 2017-06-09 NOTE — ED Provider Notes (Signed)
[facility] EMERGENCY DEPARTMENT Provider Note   CSN: [id number] Arrival date & time: 06/09/17  1102     History   Chief Complaint Chief Complaint  Patient presents with  . Sore Throat    HPI Melissa Shea is a 26 y.o. female.   Sore Throat  This is a new problem. The current episode started more than 2 days ago. The problem occurs daily. The problem has been gradually worsening. Pertinent negatives include no chest pain, no abdominal pain, no headaches and no shortness of breath. Associated symptoms comments: Hoarseness. Nothing aggravates the symptoms. Nothing relieves the symptoms. She has tried acetaminophen (Hot tea) for the symptoms.    Past Medical History:  Diagnosis Date  . Schizophrenia (HCC)   . Seizures (HCC)     There are no active problems to display for this patient.   No past surgical history on file.  OB History    No data available       Home Medications    Prior to Admission medications   Medication Sig Start Date End Date Taking? Authorizing Provider  doxycycline (VIBRAMYCIN) 100 MG capsule Take 1 capsule (100 mg total) by mouth 2 (two) times daily. 10/13/16   Rancour, Jeannett Senior, MD  levonorgestrel (MIRENA) 20 MCG/24HR IUD 1 each by Intrauterine route once.    [provider]  LORazepam (ATIVAN) 0.5 MG tablet Take 1 tablet (0.5 mg total) by mouth every 6 (six) hours as needed for anxiety. Patient not taking: Reported on 02/12/2015 03/12/14   Gerhard Munch, MD  ondansetron (ZOFRAN) 4 MG tablet Take 1 tablet (4 mg total) by mouth every 8 (eight) hours as needed. 05/01/16   [name], MD  risperiDONE (RISPERDAL) 1 MG tablet Take 1 mg by mouth at bedtime.    [provider]  sertraline (ZOLOFT) 25 MG tablet Take 25 mg by mouth daily.    [provider]    Family History No family history on file.  Social History Social History  Substance Use Topics  . Smoking status: Current Every Day Smoker  . Smokeless  tobacco: Never Used  . Alcohol use No     Allergies   Sulfa antibiotics and Sulfur   Review of Systems Review of Systems  Constitutional: Negative for activity change.       All ROS Neg except as noted in HPI  HENT: Positive for congestion, sinus pressure, sore throat and voice change. Negative for nosebleeds.   Eyes: Negative for photophobia and discharge.  Respiratory: Negative for cough, shortness of breath and wheezing.   Cardiovascular: Negative for chest pain and palpitations.  Gastrointestinal: Negative for abdominal pain and blood in stool.  Genitourinary: Negative for dysuria, frequency and hematuria.  Musculoskeletal: Negative for arthralgias, back pain and neck pain.  Skin: Negative.   Neurological: Negative for dizziness, seizures, speech difficulty and headaches.  Psychiatric/Behavioral: Negative for confusion and hallucinations.     Physical Exam Updated Vital Signs BP 123/71 (BP Location: Right Arm)   Pulse 77   Temp 98.3 F (36.8 C) (Oral)   Resp 18   Ht 5\' 4"  (1.626 m)   Wt 93 kg (205 lb)   SpO2 100%   BMI 35.19 kg/m   Physical Exam  Constitutional: She is oriented to person, place, and time. She appears well-developed and well-nourished.  Non-toxic appearance.  HENT:  Head: Normocephalic.  Right Ear: Tympanic membrane and external ear normal.  Left Ear: Tympanic membrane and external ear normal.  Nasal congestion present.  Patient has hoarseness with speech.  There is mild increased redness of the posterior pharynx.  Uvula is midline.  Eyes: Pupils are equal, round, and reactive to light. EOM and lids are normal.  Neck: Normal range of motion. Neck supple. Carotid bruit is not present.  Cardiovascular: Normal rate, regular rhythm, normal heart sounds, intact distal pulses and normal pulses.   Pulmonary/Chest: Breath sounds normal. No respiratory distress.  Abdominal: Soft. Bowel sounds are normal. There is no tenderness. There is no guarding.    Musculoskeletal: Normal range of motion.  Lymphadenopathy:       Head (right side): No submandibular adenopathy present.       Head (left side): No submandibular adenopathy present.    She has no cervical adenopathy.  Neurological: She is alert and oriented to person, place, and time. She has normal strength. No cranial nerve deficit or sensory deficit.  Skin: Skin is warm and dry.  Psychiatric: She has a normal mood and affect. Her speech is normal.  Nursing note and vitals reviewed.    ED Treatments / Results  Labs (all labs ordered are listed, but only abnormal results are displayed) Labs Reviewed  RAPID STREP SCREEN (NOT AT [facility])  CULTURE, GROUP A STREP [facility])    EKG  EKG Interpretation None       Radiology No results found.  Procedures Procedures (including critical care time)  Medications Ordered in ED Medications - No data to display   Initial Impression / Assessment and Plan / ED Course  I have reviewed the triage vital signs and the nursing notes.  Pertinent labs & imaging results that were available during my care of the patient were reviewed by me and considered in my medical decision making (see chart for details).    The  Final Clinical Impressions(s) / ED Diagnoses MDM Vital signs within normal limits.  Rapid strep screen is negative.  Patient has change in her voice with hoarseness that started today.  I suspect a viral pharyngitis with upper respiratory infection.  I have asked the patient to use salt water gargles.  Chloraseptic spray, Tylenol, and/or ibuprofen.  I reminded the patient about the importance of washing hands frequently and also maintaining good hydration.  The patient acknowledges understanding of the instructions and will return to the emergency department if any changes, problems, or concerns.   Final diagnoses:  Viral pharyngitis  Hoarseness of voice    New Prescriptions New Prescriptions   No medications on file      Ivery QualeBryant, Teegan Guinther, Cordelia Poche-C 06/09/17 1316    Vanetta MuldersZackowski, Scott, MD 06/10/17 [phone]

## 2017-06-09 NOTE — ED Triage Notes (Signed)
Sore throat x5 days, now having pain in right ear/jaw.

## 2017-06-09 NOTE — Discharge Instructions (Signed)
Your vital signs are within normal limits.  Your oxygen level is 100% on room air.  The examination suggest a viral pharyngitis with hoarseness.  The strep screen is negative.  Please wash hands frequently.  Please increase fluids.  Salt water gargles and Chloraseptic spray may be helpful.  Please rest her voice as much as possible.  Use Tylenol every 4 hours, and/or ibuprofen every 6 hours for aching, fever, or pain.

## 2017-06-12 LAB — CULTURE, GROUP A STREP (THRC)

## 2019-03-07 ENCOUNTER — Encounter (HOSPITAL_COMMUNITY): Payer: Self-pay | Admitting: Emergency Medicine

## 2019-03-07 ENCOUNTER — Emergency Department (HOSPITAL_COMMUNITY)
Admission: EM | Admit: 2019-03-07 | Discharge: 2019-03-07 | Disposition: A | Discharge status: Home / Self Care | Payer: HRSA Program | Attending: Emergency Medicine | Admitting: Emergency Medicine

## 2019-03-07 ENCOUNTER — Other Ambulatory Visit: Payer: Self-pay

## 2019-03-07 ENCOUNTER — Emergency Department (HOSPITAL_COMMUNITY): Payer: HRSA Program

## 2019-03-07 DIAGNOSIS — J029 Acute pharyngitis, unspecified: Secondary | ICD-10-CM | POA: Insufficient documentation

## 2019-03-07 DIAGNOSIS — Z79899 Other long term (current) drug therapy: Secondary | ICD-10-CM | POA: Diagnosis not present

## 2019-03-07 DIAGNOSIS — Z87891 Personal history of nicotine dependence: Secondary | ICD-10-CM | POA: Insufficient documentation

## 2019-03-07 DIAGNOSIS — Z20828 Contact with and (suspected) exposure to other viral communicable diseases: Secondary | ICD-10-CM | POA: Insufficient documentation

## 2019-03-07 DIAGNOSIS — M940 Chondrocostal junction syndrome [Tietze]: Secondary | ICD-10-CM | POA: Insufficient documentation

## 2019-03-07 LAB — GROUP A STREP BY PCR: Group A Strep by PCR: NOT DETECTED

## 2019-03-07 LAB — SARS CORONAVIRUS 2 BY RT PCR (HOSPITAL ORDER, PERFORMED IN ~~LOC~~ HOSPITAL LAB): SARS Coronavirus 2: NEGATIVE

## 2019-03-07 LAB — D-DIMER, QUANTITATIVE: D-Dimer, Quant: 0.33 ug/mL-FEU (ref 0.00–0.50)

## 2019-03-07 LAB — POC URINE PREG, ED: Preg Test, Ur: NEGATIVE

## 2019-03-07 MED ORDER — ONDANSETRON 8 MG PO TBDP
8.0000 mg | ORAL_TABLET | Freq: Once | ORAL | Status: AC
  Administered 2019-03-07: 8 mg via ORAL
  Filled 2019-03-07: qty 1

## 2019-03-07 MED ORDER — IBUPROFEN 800 MG PO TABS
800.0000 mg | ORAL_TABLET | Freq: Once | ORAL | Status: AC
  Administered 2019-03-07: 17:00:00 800 mg via ORAL
  Filled 2019-03-07: qty 1

## 2019-03-07 MED ORDER — ONDANSETRON HCL 4 MG PO TABS: 4.0000 mg | ORAL_TABLET | Freq: Four times a day (QID) | ORAL | 0 refills | Status: AC

## 2019-03-07 MED ORDER — IBUPROFEN 800 MG PO TABS: 800.0000 mg | ORAL_TABLET | Freq: Three times a day (TID) | ORAL | 0 refills | Status: AC

## 2019-03-07 NOTE — Discharge Instructions (Signed)
Take the medication as directed.  You may also try over-the-counter Chloraseptic spray as directed if needed for your sore throat.  Follow-up with your primary doctor for recheck return to ER for any worsening symptoms such as increasing chest pain, fever, or shortness of breath.

## 2019-03-07 NOTE — ED Triage Notes (Signed)
Patient reports sore throat, SOB, nausea and headache that started on Sunday.

## 2019-03-07 NOTE — ED Provider Notes (Signed)
[facility] EMERGENCY DEPARTMENT Provider Note   CSN: [id number] Arrival date & time: 03/07/19  1256     History   Chief Complaint Chief Complaint  Patient presents with  . Sore Throat    HPI Melissa Shea is a 28 y.o. female.     HPI   Melissa Shea is a 28 y.o. female who presents to the Emergency Department complaining of substernal chest pain that began 3 days ago.  She describes the pain as sharp and associated with deep breathing.  Initially the pain was mild, but has gradually increased in severity.  Her symptoms are now associated with mild shortness of breath, cough, frontal headache, nausea and sore throat.  She states she was exposed to someone with COVID earlier this month.  She denies fever, chills, neck pain or stiffness, body aches and dysuria.  She does not take birth control or admit to recent travel, no recent surgeries   Past Medical History:  Diagnosis Date  . Schizophrenia ([place])   . Seizures ([place])     There are no active problems to display for this patient.   History reviewed. No pertinent surgical history.   OB History    Gravida  1   Para  1   Term  1   Preterm      AB      Living        SAB      TAB      Ectopic      Multiple      Live Births               Home Medications    Prior to Admission medications   Medication Sig Start Date End Date Taking? Authorizing Provider  doxycycline (VIBRAMYCIN) 100 MG capsule Take 1 capsule (100 mg total) by mouth 2 (two) times daily. 10/13/16   Rancour, Annie Main, MD  levonorgestrel (MIRENA) 20 MCG/24HR IUD 1 each by Intrauterine route once.    [provider]  LORazepam (ATIVAN) 0.5 MG tablet Take 1 tablet (0.5 mg total) by mouth every 6 (six) hours as needed for anxiety. Patient not taking: Reported on 02/12/2015 03/12/14   Carmin Muskrat, MD  ondansetron (ZOFRAN) 4 MG tablet Take 1 tablet (4 mg total) by mouth every 8 (eight) hours as needed. 05/01/16   Rolland Porter,  MD  risperiDONE (RISPERDAL) 1 MG tablet Take 1 mg by mouth at bedtime.    [provider]  sertraline (ZOLOFT) 25 MG tablet Take 25 mg by mouth daily.    [provider]    Family History History reviewed. No pertinent family history.  Social History Social History   Tobacco Use  . Smoking status: Former Research scientist (life sciences)  . Smokeless tobacco: Never Used  Substance Use Topics  . Alcohol use: No  . Drug use: No     Allergies   Sulfa antibiotics and Sulfur   Review of Systems Review of Systems  Constitutional: Negative for activity change, appetite change, chills and fever.  HENT: Positive for sore throat. Negative for congestion, ear pain, facial swelling, trouble swallowing and voice change.   Respiratory: Positive for cough and shortness of breath.   Cardiovascular: Positive for chest pain.  Gastrointestinal: Positive for nausea. Negative for abdominal pain, diarrhea and vomiting.  Genitourinary: Negative for dysuria and flank pain.  Musculoskeletal: Negative for arthralgias, neck pain and neck stiffness.  Skin: Negative for color change and rash.  Neurological: Negative for dizziness, facial asymmetry, speech  difficulty, weakness, numbness and headaches.  Hematological: Negative for adenopathy.     Physical Exam Updated Vital Signs BP (!) 112/54 (BP Location: Right Arm)   Pulse 62   Temp 97.6 F (36.4 C) (Oral)   Resp 20   LMP 02/22/2019   SpO2 100%   Physical Exam Vitals signs and nursing note reviewed.  Constitutional:      Appearance: She is well-developed. She is not ill-appearing.  HENT:     Head: Atraumatic.     Right Ear: Tympanic membrane normal.     Left Ear: Tympanic membrane normal.     Mouth/Throat:     Mouth: Mucous membranes are moist. No oral lesions.     Pharynx: Oropharynx is clear. Uvula midline. Posterior oropharyngeal erythema present. No pharyngeal swelling, oropharyngeal exudate or uvula swelling.     Tonsils: No tonsillar  exudate.  Neck:     Musculoskeletal: Full passive range of motion without pain. No muscular tenderness.     Meningeal: Kernig's sign absent.  Cardiovascular:     Rate and Rhythm: Normal rate and regular rhythm.     Pulses: Normal pulses.  Pulmonary:     Effort: Pulmonary effort is normal. No respiratory distress.     Breath sounds: Normal breath sounds. No wheezing or rales.     Comments: Pt is speaking in full sentences w/o respiratory distress.  Abdominal:     General: There is no distension.     Palpations: Abdomen is soft.     Tenderness: There is no abdominal tenderness.  Musculoskeletal: Normal range of motion.  Lymphadenopathy:     Cervical: No cervical adenopathy.  Skin:    General: Skin is warm.     Capillary Refill: Capillary refill takes less than 2 seconds.     Findings: No erythema or rash.  Neurological:     General: No focal deficit present.     Sensory: No sensory deficit.     Motor: No weakness.      ED Treatments / Results  Labs (all labs ordered are listed, but only abnormal results are displayed) Labs Reviewed  GROUP A STREP BY PCR  SARS CORONAVIRUS 2 (HOSPITAL ORDER, [facility]FOR[facility])  D-DIMER, QUANTITATIVE (NOT AT [facility])  POC URINE PREG, ED    EKG EKG Interpretation  Date/Time:  Wednesday March 07 2019 15:34:15 EDT Ventricular Rate:  64 PR Interval:    QRS Duration: 97 QT Interval:  439 QTC Calculation: 453 R Axis:   32 Text Interpretation:  Sinus rhythm Low voltage, precordial leads No STEMI  Confirmed by Alona BeneLong, Joshua [id number]) on 03/07/2019 3:55:05 PM   Radiology Dg Chest Portable 1 View  Result Date: 03/07/2019 CLINICAL DATA:  Sore throat, short of breath.  Nausea. EXAM: PORTABLE CHEST 1 VIEW COMPARISON:  02/06/2015 FINDINGS: The heart size and mediastinal contours are within normal limits. Both lungs are clear. The visualized skeletal structures are unremarkable. IMPRESSION: No active disease. Electronically Signed    By: Marlan Palauharles  Clark M.D.   On: 03/07/2019 15:47    Procedures Procedures (including critical care time)  Medications Ordered in ED Medications - No data to display   Initial Impression / Assessment and Plan / ED Course  I have reviewed the triage vital signs and the nursing notes.  Pertinent labs & imaging results that were available during my care of the patient were reviewed by me and considered in my medical decision making (see chart for details).  Patient with pleuritic chest pain, intermittent dyspnea, sore throat and recent exposure to someone with COVID.  She is nontoxic-appearing.  Vital signs reviewed.  Will obtain labs including COVID testing, d-dimer and chest x-ray.   Work up reassuring and COVID testing is negative.  Chest pain is sternal and reproducable with palpation and movement.  Felt to be related to costochondritis.   D dimer negative.  No nuchal rigidity and abd is soft and NT.  She appears appropriate for d/c home and return precautions discussed.    Final Clinical Impressions(s) / ED Diagnoses   Final diagnoses:  Costochondritis  Sore throat    ED Discharge Orders    None       Pauline Ausriplett, Elayah Klooster, PA-C 03/07/19 1713    Long, Arlyss RepressJoshua G, MD 03/08/19 1407

## 2021-01-27 IMAGING — CR PORTABLE CHEST - 1 VIEW
1 series · 2 of 2 positions shown · non-contrast
Comparison: 02/06/2015

CLINICAL DATA: Sore throat, short of breath.  Nausea.

EXAM:
PORTABLE CHEST 1 VIEW

[Series 1: portable · 0.17mm/px · 2 of 2 slices shown]
[im 1/2]
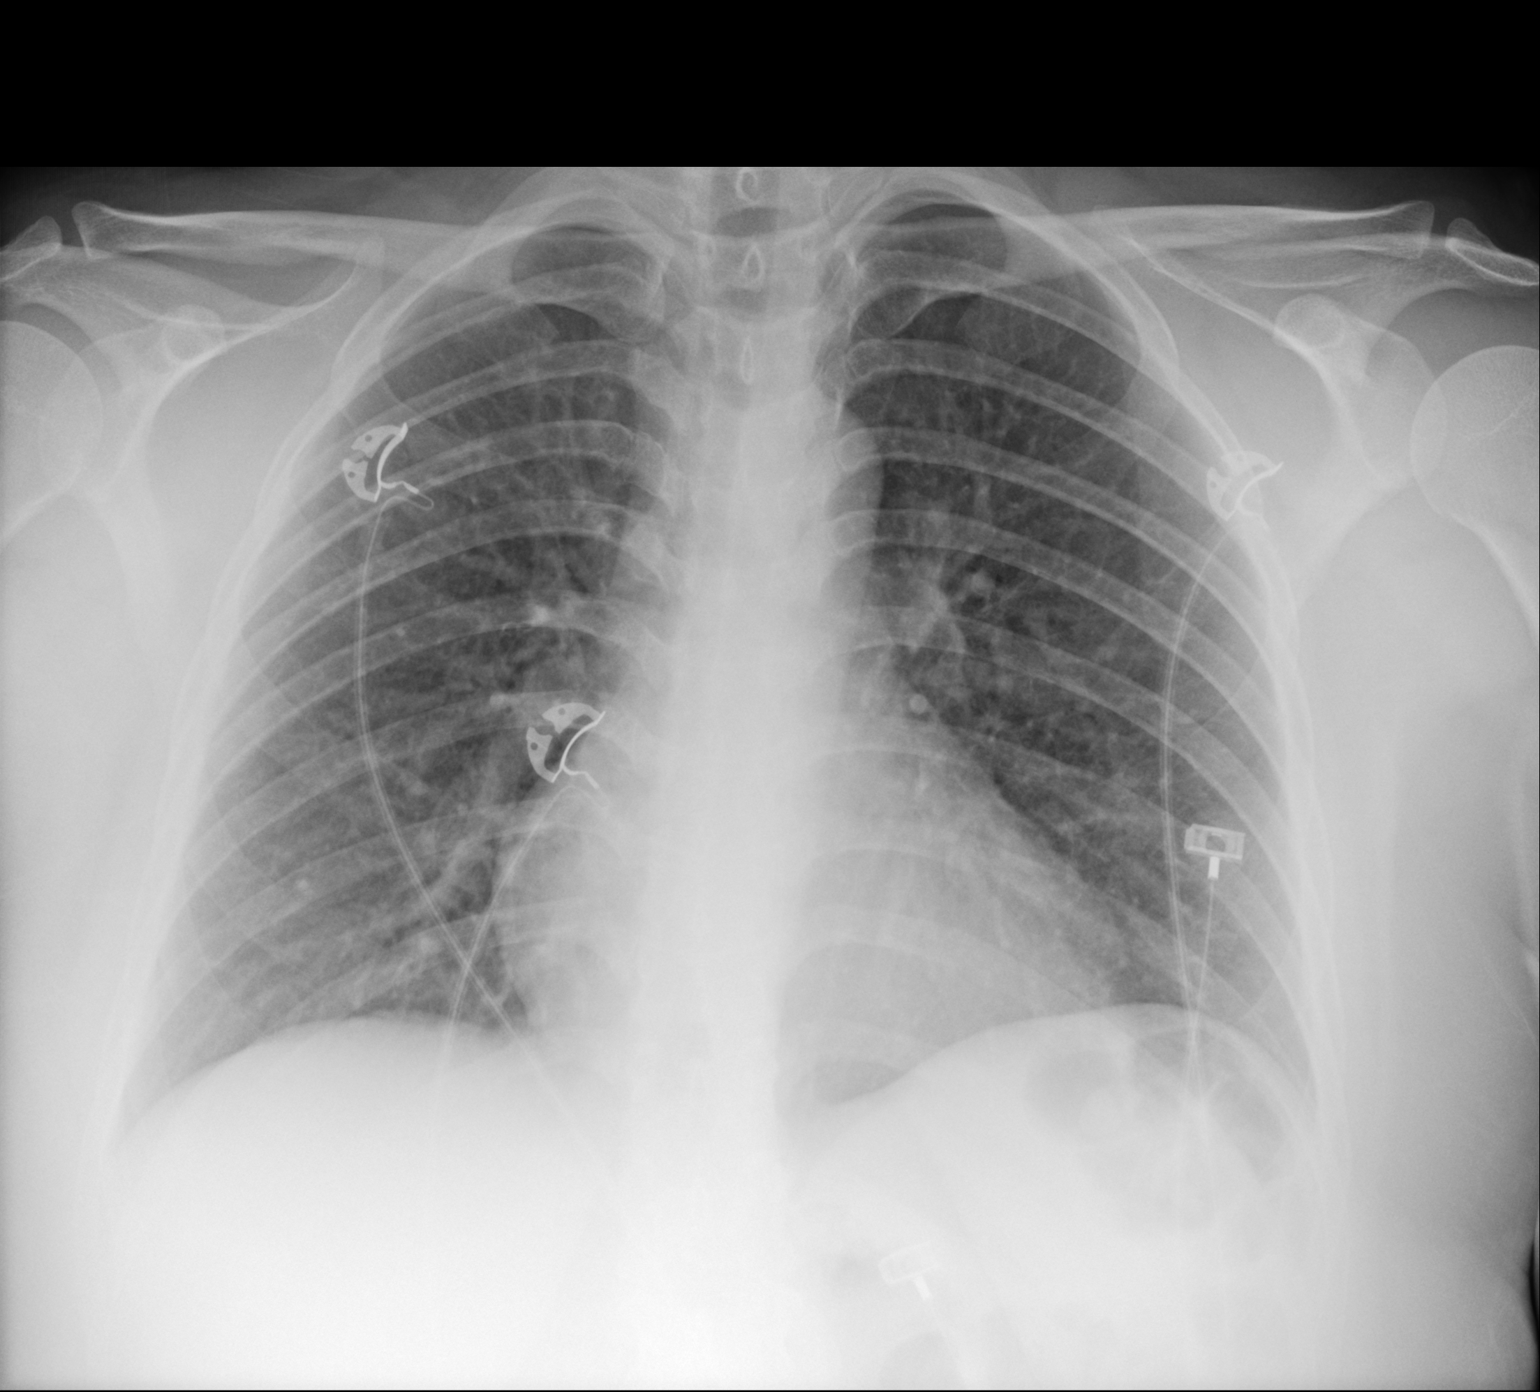
[im 2/2]
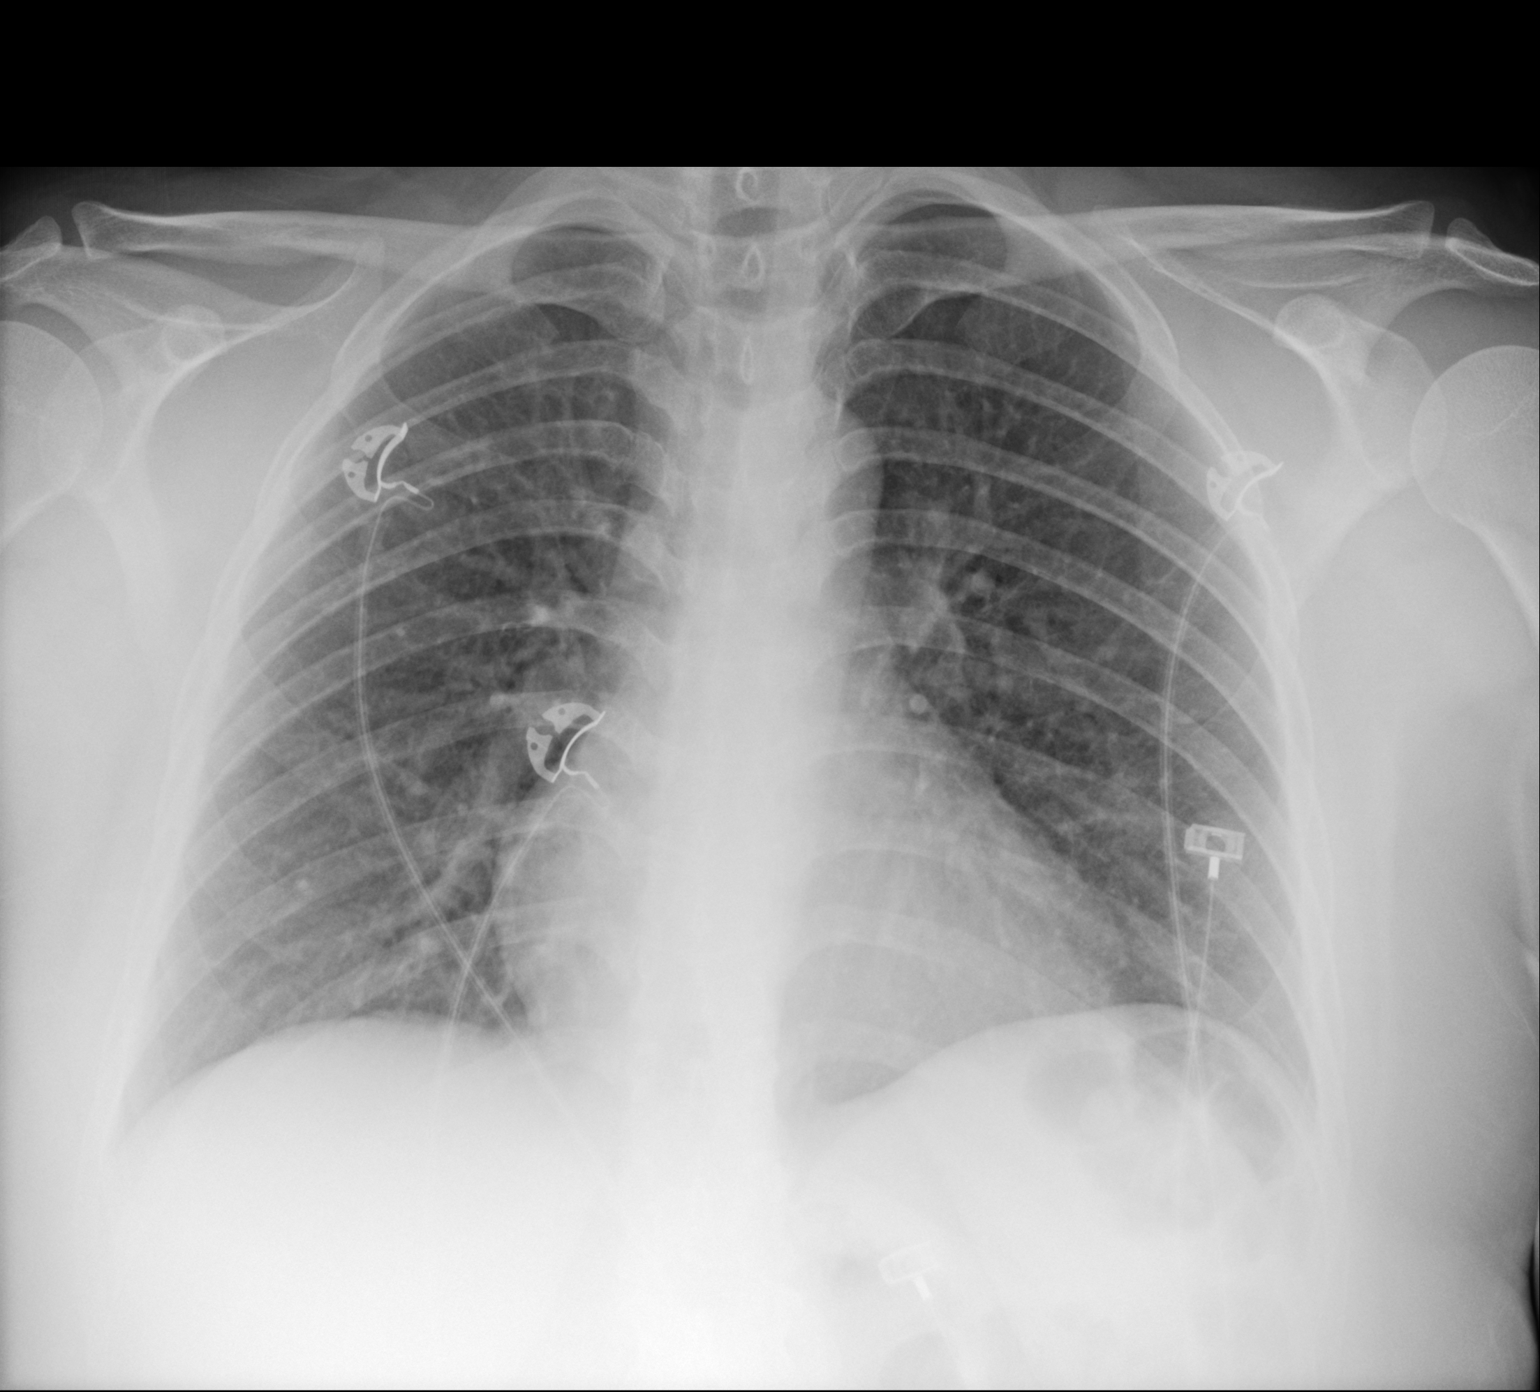

[2 of 2 positions shown; findings below may reference images not displayed]

FINDINGS: The heart size and mediastinal contours are within normal limits.
Both lungs are clear. The visualized skeletal structures are
unremarkable.
IMPRESSION: No active disease.
